# Patient Record
Sex: Female | Born: 1990 | Hispanic: Yes | Marital: Married | Smoking: Never smoker
Health system: Southern US, Community
[De-identification: ages and names within clinical notes are randomized; demographics above are authoritative.]

## PROBLEM LIST (undated history)

## (undated) DIAGNOSIS — Z789 Other specified health status: Secondary | ICD-10-CM

## (undated) HISTORY — PX: OTHER SURGICAL HISTORY: SHX169

## (undated) HISTORY — DX: Other specified health status: Z78.9

---

## 2011-02-13 ENCOUNTER — Emergency Department: Payer: Self-pay | Admitting: Emergency Medicine

## 2012-02-04 ENCOUNTER — Emergency Department: Payer: Self-pay | Admitting: Emergency Medicine

## 2012-02-04 LAB — URINALYSIS, COMPLETE
Bilirubin,UR: NEGATIVE
Glucose,UR: NEGATIVE mg/dL (ref 0–75)
Ketone: NEGATIVE
Leukocyte Esterase: NEGATIVE
Ph: 6 (ref 4.5–8.0)
Protein: NEGATIVE
RBC,UR: 2 /HPF (ref 0–5)
WBC UR: 2 /HPF (ref 0–5)

## 2012-02-04 LAB — CBC
HCT: 43.6 % (ref 35.0–47.0)
HGB: 14.5 g/dL (ref 12.0–16.0)
MCH: 29.8 pg (ref 26.0–34.0)
MCHC: 33.3 g/dL (ref 32.0–36.0)
MCV: 90 fL (ref 80–100)
RDW: 14.3 % (ref 11.5–14.5)
WBC: 14.5 10*3/uL — ABNORMAL HIGH (ref 3.6–11.0)

## 2012-02-04 LAB — COMPREHENSIVE METABOLIC PANEL
Albumin: 4.6 g/dL (ref 3.4–5.0)
Anion Gap: 7 (ref 7–16)
BUN: 14 mg/dL (ref 7–18)
Creatinine: 0.83 mg/dL (ref 0.60–1.30)
EGFR (African American): >60
Osmolality: 284 (ref 275–301)
Potassium: 3.7 mmol/L (ref 3.5–5.1)
SGOT(AST): 21 U/L (ref 15–37)
Sodium: 142 mmol/L (ref 136–145)
Total Protein: 8.9 g/dL — ABNORMAL HIGH (ref 6.4–8.2)

## 2012-02-04 LAB — LIPASE, BLOOD: Lipase: 71 U/L — ABNORMAL LOW (ref 73–393)

## 2015-09-27 ENCOUNTER — Encounter: Payer: Self-pay | Admitting: Emergency Medicine

## 2015-09-27 ENCOUNTER — Emergency Department: Payer: Self-pay

## 2015-09-27 ENCOUNTER — Emergency Department
Admission: EM | Admit: 2015-09-27 | Discharge: 2015-09-27 | Disposition: A | Discharge status: Home / Self Care | Payer: Self-pay | Attending: Emergency Medicine | Admitting: Emergency Medicine

## 2015-09-27 DIAGNOSIS — O209 Hemorrhage in early pregnancy, unspecified: Secondary | ICD-10-CM | POA: Insufficient documentation

## 2015-09-27 DIAGNOSIS — Z3A08 8 weeks gestation of pregnancy: Secondary | ICD-10-CM | POA: Insufficient documentation

## 2015-09-27 DIAGNOSIS — O200 Threatened abortion: Secondary | ICD-10-CM | POA: Insufficient documentation

## 2015-09-27 LAB — URINALYSIS COMPLETE WITH MICROSCOPIC (ARMC ONLY)
BILIRUBIN URINE: NEGATIVE
Bacteria, UA: NONE SEEN
GLUCOSE, UA: NEGATIVE mg/dL
Ketones, ur: NEGATIVE mg/dL
Leukocytes, UA: NEGATIVE
NITRITE: NEGATIVE
Protein, ur: NEGATIVE mg/dL
SPECIFIC GRAVITY, URINE: 1.01 (ref 1.005–1.030)
pH: 6 (ref 5.0–8.0)

## 2015-09-27 LAB — COMPREHENSIVE METABOLIC PANEL
ALT: 13 U/L — ABNORMAL LOW (ref 14–54)
AST: 17 U/L (ref 15–41)
Albumin: 4.4 g/dL (ref 3.5–5.0)
Alkaline Phosphatase: 50 U/L (ref 38–126)
Anion gap: 9 (ref 5–15)
BUN: 10 mg/dL (ref 6–20)
CHLORIDE: 104 mmol/L (ref 101–111)
CO2: 22 mmol/L (ref 22–32)
CREATININE: 0.47 mg/dL (ref 0.44–1.00)
Calcium: 9.3 mg/dL (ref 8.9–10.3)
Glucose, Bld: 80 mg/dL (ref 65–99)
POTASSIUM: 3.6 mmol/L (ref 3.5–5.1)
Sodium: 135 mmol/L (ref 135–145)
Total Bilirubin: 0.7 mg/dL (ref 0.3–1.2)
Total Protein: 7.6 g/dL (ref 6.5–8.1)

## 2015-09-27 LAB — CBC
HCT: 38.7 % (ref 35.0–47.0)
Hemoglobin: 13.3 g/dL (ref 12.0–16.0)
MCH: 31 pg (ref 26.0–34.0)
MCHC: 34.4 g/dL (ref 32.0–36.0)
MCV: 90.3 fL (ref 80.0–100.0)
PLATELETS: 202 10*3/uL (ref 150–440)
RBC: 4.29 MIL/uL (ref 3.80–5.20)
RDW: 13.6 % (ref 11.5–14.5)
WBC: 8.4 10*3/uL (ref 3.6–11.0)

## 2015-09-27 LAB — HCG, QUANTITATIVE, PREGNANCY: HCG, BETA CHAIN, QUANT, S: 22579 m[IU]/mL — AB (ref <5)

## 2015-09-27 LAB — ABO/RH: ABO/RH(D): O POS

## 2015-09-27 NOTE — Discharge Instructions (Signed)
Your ultrasound shows a pregnancy in the uterus.  Your pregnancy hormone (beta HCG) level today is 22,600. Follow-up with obstetrics for further monitoring of your symptoms.  Amenaza de aborto (Threatened Miscarriage) La amenaza de aborto se produce cuando hay hemorragia vaginal durante las primeras 20semanas de [place], pero el embarazo no se interrumpe. Si durante este perodo usted tiene hemorragia vaginal, el mdico le har pruebas para asegurarse de que el embarazo contine. Si las pruebas muestran que usted contina embarazada y que el "beb" en desarrollo (feto) dentro del tero sigue creciendo, se considera que tuvo una [place] de aborto. La amenaza de aborto no implica que el embarazo vaya a Midwife, pero s aumenta el riesgo de perder el embarazo (aborto completo). CAUSAS  Por lo general, no se conoce la causa de la amenaza de aborto. Si el resultado final es el aborto completo, la causa ms frecuente es la cantidad anormal de cromosomas del feto. Los cromosomas son las estructuras internas de las clulas que contienen todo Animator gentico. Environmental manager de las causas de hemorragia vaginal que no ocasionan un aborto incluyen:  Las Clinical research associate.  Las infecciones.  Los cambios hormonales normales durante el [place].  La hemorragia que se produce cuando el vulo se implanta en el tero. FACTORES DE RIESGO Los factores de riesgo de hemorragia al principio del embarazo incluyen:  Obesidad.  Fumar.  El consumo de cantidades excesivas de alcohol o cafena.  El consumo de drogas. SIGNOS Y SNTOMAS  Hemorragia vaginal leve.  Dolor o clicos abdominales leves. DIAGNSTICO  Si tiene hemorragia con o sin dolor abdominal antes de las 20semanas de [place], el mdico le har pruebas para determinar si el embarazo contina. Una prueba importante incluye el uso de ondas sonoras y de una computadora (ecografa) para crear imgenes del interior del tero. Otras pruebas incluyen el  examen interno de la vagina y el tero (examen plvico), y el control de la frecuencia cardaca del feto.  Es posible que le diagnostiquen una amenaza de aborto en los siguientes casos:  La ecografa muestra que el embarazo contina.  La frecuencia cardaca del feto es alta.  El examen plvico muestra que la apertura entre el tero y la vagina (cuello del tero) est cerrada.  Su frecuencia cardaca y su presin arterial estn estables.  Los [employer] Corporation de sangre confirman que el embarazo contina. TRATAMIENTO  No se ha demostrado que ningn tratamiento evite que una amenaza de aborto se [place] en un aborto completo. Sin embargo, los cuidados [employer] hogar son importantes.  INSTRUCCIONES PARA EL CUIDADO EN EL HOGAR   Asegrese de asistir a todas las citas de cuidados prenatales. Esto es [employer].  Descanse lo suficiente.  No tenga relaciones sexuales ni use tampones si tiene hemorragia vaginal.  No se haga duchas vaginales.  No fume ni consuma drogas.  No beba alcohol.  Evite la cafena. SOLICITE ATENCIN MDICA SI:  Tiene una ligera hemorragia o manchado vaginal durante el embarazo.  Tiene dolor o clicos en el abdomen.  Tiene fiebre. SOLICITE ATENCIN MDICA DE INMEDIATO SI:  Tiene una hemorragia vaginal abundante.  Elimina cogulos de sangre por la vagina.  Siente dolor en la parte baja de la espalda o clicos abdominales intensos.  Tiene fiebre, escalofros y dolor abdominal intenso. ASEGRESE DE QUE:  Comprende estas instrucciones.  Controlar su afeccin.  Recibir ayuda de inmediato si no mejora o si empeora.   Esta informacin no tiene Theme park manager el consejo del mdico. Manufacturing engineer  de hacerle al mdico cualquier pregunta que tenga.   Document Released: 01/06/2005 Document Revised: 04/03/2013 Elsevier Interactive Patient Education 2016 [employer]  Hemorragia vaginal durante Firefighterel embarazo (primer trimestre) (Vaginal Bleeding  During Pregnancy, First Trimester) Durante los primeros meses del embarazo es relativamente frecuente que se presente una pequea hemorragia (manchas). Esta situacin generalmente mejora por s misma. Estas hemorragias o manchas tienen diversas causas al inicio del embarazo. Algunas manchas pueden estar relacionadas al Big Lotsembarazo y otras no. En la International Business Machinesmayora de los casos, la hemorragia es normal y no es un problema. Sin embargo, la hemorragia tambin puede ser un signo de algo grave. Debe informar a su mdico de inmediato si tiene alguna hemorragia vaginal. Algunas causas posibles de hemorragia vaginal durante el primer trimestre incluyen:  Infeccin o inflamacin del cuello del tero.  Crecimientos anormales (plipos) en el cuello del tero.  Aborto espontneo o amenaza de aborto espontneo.  Tejido del Psychiatristembarazo se ha desarrollado fuera del tero y en una trompa de falopio (embarazo ectpico).  Se han desarrollado pequeos quistes en el tero en lugar de tejido de embarazo (embarazo molar). INSTRUCCIONES PARA EL CUIDADO EN EL HOGAR  Controle su afeccin para ver si hay cambios. Las siguientes indicaciones ayudarn a Psychologist, educationalaliviar cualquier Longs Drug Storesmolestia que pueda sentir:  Siga las indicaciones del mdico para restringir su actividad. Si el mdico le indica descanso en la cama, debe quedarse en la cama y levantarse solo para ir al bao. No obstante, el mdico puede permitirle que continu con tareas livianas.  Si es necesario, organcese para que alguien le ayude con las actividades y responsabilidades cotidianas mientras est en cama.  Lleve un registro de la cantidad y la saturacin de las toallas higinicas que Landscape architectutiliza cada da. Anote este dato.  No use tampones.No se haga duchas vaginales.  No tenga relaciones sexuales u orgasmos hasta que el mdico la autorice.  Si elimina tejido por la vagina, gurdelo para mostrrselo al American Expressmdico.  North[place]ome solo medicamentos de venta libre o recetados, segn las  indicaciones del mdico.  No tome aspirina, ya que puede causar hemorragias.  Cumpla con todas las visitas de control, segn le indique su mdico. SOLICITE ATENCIN MDICA SI:  Tiene un sangrado vaginal en cualquier momento del embarazo.  Tiene calambres o dolores de Hawthorneparto.  Tiene fiebre que los medicamentos no Sports coachpueden controlar. SOLICITE ATENCIN MDICA DE INMEDIATO SI:   Siente calambres intensos en la espalda o en el vientre (abdomen).  Elimina cogulos grandes o tejido por la vagina.  La hemorragia aumenta.  Si se siente mareada, dbil o se desmaya.  Tiene escalofros.  Tiene una prdida importante o sale lquido a borbotones por la vagina.  Se desmaya mientras defeca. ASEGRESE DE QUE:  Comprende estas instrucciones.  Controlar su afeccin.  Recibir ayuda de inmediato si no mejora o si empeora.   Esta informacin no tiene Theme park managercomo fin reemplazar el consejo del mdico. Asegrese de hacerle al mdico cualquier pregunta que tenga.   Document Released: 01/06/2005 Document Revised: 04/03/2013 Elsevier Interactive Patient Education [employer]2016 Elsevier Inc.

## 2015-09-27 NOTE — ED Provider Notes (Signed)
[facility] Emergency Department Provider Note  ____________________________________________  Time seen: 3:50 PM  I have reviewed the triage vital signs and the nursing notes.   HISTORY  Chief Complaint Vaginal Bleeding Patient interviewed and examined with Spanish interpreter at bedside   HPI Dominique Durham is a 25 y.o. female who reports vaginal bleeding today described as spotting. LMP is 07/31/2015, she is about [redacted] weeks pregnant which she just discovered within the last week. She has not had prenatal care yet but is taking prenatal vitamins. She is pregnant once before and has a 32-year-old daughter. She is certain that her blood type is O+.     No past medical history on file. None  There are no active problems to display for this patient.    No past surgical history on file. None  Current Outpatient Rx  Name  Route  Sig  Dispense  Refill  . Prenatal Vit-Fe Fumarate-FA (PRENATAL MULTIVITAMIN) TABS tablet   Oral   Take 1 tablet by mouth every morning.            Allergies Review of patient's allergies indicates no known allergies.   No family history on file.  Social History Social History  Substance Use Topics  . Smoking status: Not on file  . Smokeless tobacco: Not on file  . Alcohol Use: Not on file  No tobacco alcohol or drug use  Review of Systems  Constitutional:   No fever or chills.  Eyes:   No vision changes.  ENT:   No sore throat. No rhinorrhea. Cardiovascular:   No chest pain. Respiratory:   No dyspnea or cough. Gastrointestinal:   Negative for abdominal pain, vomiting and diarrhea.  Genitourinary:   Negative for dysuria or difficulty urinating. Musculoskeletal:   Negative for focal pain or swelling Neurological:   Negative for headaches 10-point ROS otherwise negative.  ____________________________________________   PHYSICAL EXAM:  VITAL SIGNS: ED Triage Vitals  Enc Vitals Group     BP 09/27/15  1234 126/71 mmHg     Pulse Rate 09/27/15 1234 74     Resp 09/27/15 1234 20     Temp 09/27/15 1234 98.7 F (37.1 C)     Temp Source 09/27/15 1234 Oral     SpO2 09/27/15 1234 99 %     Weight 09/27/15 1234 112 lb (50.803 kg)     Height 09/27/15 1234  (1.473 m)     Head Cir --      Peak Flow --      Pain Score 09/27/15 1240 5     Pain Loc --      Pain Edu? --      Excl. in GC? --     Vital signs reviewed, nursing assessments reviewed.   Constitutional:   Alert and oriented. Well appearing and in no distress. Eyes:   No scleral icterus. No conjunctival pallor. PERRL. EOMI.  No nystagmus. ENT   Head:   Normocephalic and atraumatic.   Nose:   No congestion/rhinnorhea. No septal hematoma   Mouth/Throat:   MMM, no pharyngeal erythema. No peritonsillar mass.    Neck:   No stridor. No SubQ emphysema. No meningismus. Hematological/Lymphatic/Immunilogical:   No cervical lymphadenopathy. Cardiovascular:   RRR. Symmetric bilateral radial and DP pulses.  No murmurs.  Respiratory:   Normal respiratory effort without tachypnea nor retractions. Breath sounds are clear and equal bilaterally. No wheezes/rales/rhonchi. Gastrointestinal:   Soft and nontender. Non distended. There is no CVA tenderness.  No  rebound, rigidity, or guarding. Genitourinary:   deferred Musculoskeletal:   Nontender with normal range of motion in all extremities. No joint effusions.  No lower extremity tenderness.  No edema. Neurologic:   Normal speech and language.  CN 2-10 normal. Motor grossly intact. No gross focal neurologic deficits are appreciated.  Skin:    Skin is warm, dry and intact. No rash noted.  No petechiae, purpura, or bullae.  ____________________________________________    LABS (pertinent positives/negatives) (all labs ordered are listed, but only abnormal results are displayed) Labs Reviewed  HCG, QUANTITATIVE, PREGNANCY - Abnormal; Notable for the following:    hCG, Beta Chain,  Quant, S 22579 (*)    All other components within normal limits  URINALYSIS COMPLETEWITH MICROSCOPIC ([facility] ONLY) - Abnormal; Notable for the following:    Color, Urine STRAW (*)    APPearance CLEAR (*)    Hgb urine dipstick 2+ (*)    Squamous Epithelial / LPF 0-5 (*)    All other components within normal limits  COMPREHENSIVE METABOLIC PANEL - Abnormal; Notable for the following:    ALT 13 (*)    All other components within normal limits  CBC  ABO/RH   ____________________________________________   EKG    ____________________________________________    RADIOLOGY  Ultrasound pelvis reveals live IUP, 6 weeks 1 day EGA.  ____________________________________________   PROCEDURES   ____________________________________________   INITIAL IMPRESSION / ASSESSMENT AND PLAN / ED COURSE  Pertinent labs & imaging results that were available during my care of the patient were reviewed by me and considered in my medical decision making (see chart for details).  Well-appearing no acute distress. First trimester vaginal bleeding. Low suspicion for torsion or ectopic, no abnormal discharge or pain to suggest TOA or PID or STI, but we'll get an ultrasound to evaluate pregnancy and be sure that it is an IUP. Initial lab draw did not include a blood type, and the patient declines being stuck again to check her blood type because she is sure that her blood type is O+. This is reasonable.    ----------------------------------------- 6:15 PM on 09/27/2015 -----------------------------------------  Workup unconcerning, Rh+, follow-up with OB.   ____________________________________________   FINAL CLINICAL IMPRESSION(S) / ED DIAGNOSES  Final diagnoses:  Vaginal bleeding before [redacted] weeks gestation  Threatened miscarriage       Portions of this note were generated with dragon dictation software. Dictation errors may occur despite best attempts at proofreading.   Sharman CheekPhillip  Barnes Florek, MD 09/27/15 [phone]

## 2015-09-27 NOTE — ED Notes (Signed)
Interpreter paged for assessment

## 2015-09-27 NOTE — ED Notes (Signed)
Pt here with vaginal bleeding, determined pregnancy a week ago, pt's lmp April 20, seen at the clinic last week

## 2017-04-20 LAB — HM PAP SMEAR: HM Pap smear: NEGATIVE

## 2017-12-17 LAB — HM HIV SCREENING LAB: HM HIV Screening: NEGATIVE

## 2017-12-20 DIAGNOSIS — A159 Respiratory tuberculosis unspecified: Secondary | ICD-10-CM | POA: Insufficient documentation

## 2017-12-24 DIAGNOSIS — A159 Respiratory tuberculosis unspecified: Secondary | ICD-10-CM | POA: Insufficient documentation

## 2017-12-26 ENCOUNTER — Ambulatory Visit
Admission: RE | Admit: 2017-12-26 | Discharge: 2017-12-26 | Disposition: A | Discharge status: Home / Self Care | Payer: Self-pay | Source: Ambulatory Visit | Attending: Family Medicine | Admitting: Family Medicine

## 2017-12-26 ENCOUNTER — Other Ambulatory Visit (HOSPITAL_COMMUNITY): Payer: Self-pay | Admitting: Family Medicine

## 2017-12-26 DIAGNOSIS — A159 Respiratory tuberculosis unspecified: Secondary | ICD-10-CM

## 2018-02-11 IMAGING — US US OB COMP LESS 14 WK
1 series · 13 of 28 positions shown · non-contrast
Comparison: None.

CLINICAL DATA: Patient is 7 weeks and 4 days pregnant based on her
last menstrual. Patient presents with vaginal bleeding for 9 hours.
Quantitative beta HCG level is [DATE].

EXAM:
OBSTETRIC <14 WK US AND TRANSVAGINAL OB US
TECHNIQUE: Both transabdominal and transvaginal ultrasound examinations were
performed for complete evaluation of the gestation as well as the
maternal uterus, adnexal regions, and pelvic cul-de-sac.
Transvaginal technique was performed to assess early pregnancy.

[Series 1: us ob comp less 14 wk · 0.22mm/px · 13 of 91 slices shown]
[im 4/91]
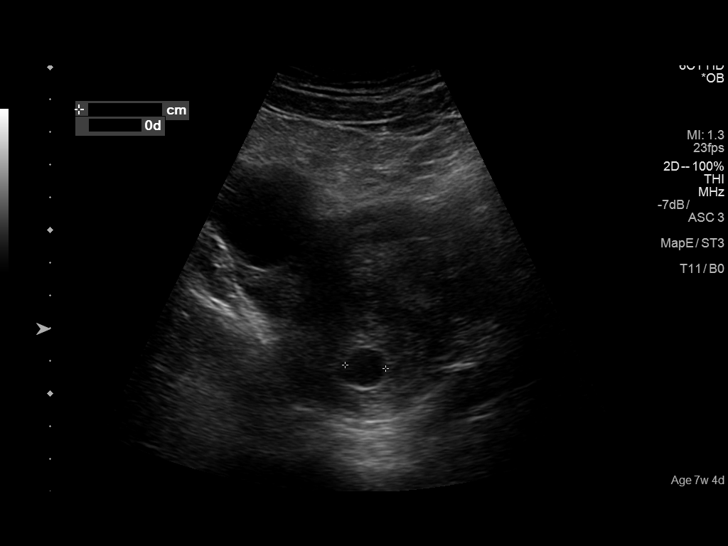
[im 11/91]
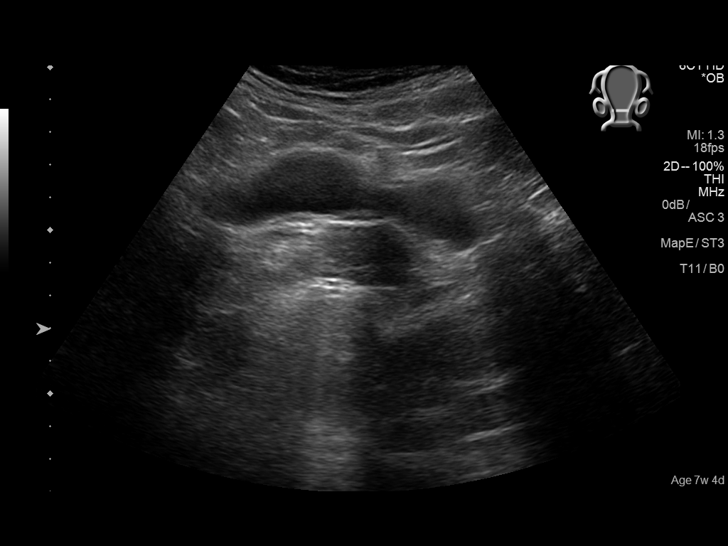
[im 17/91]
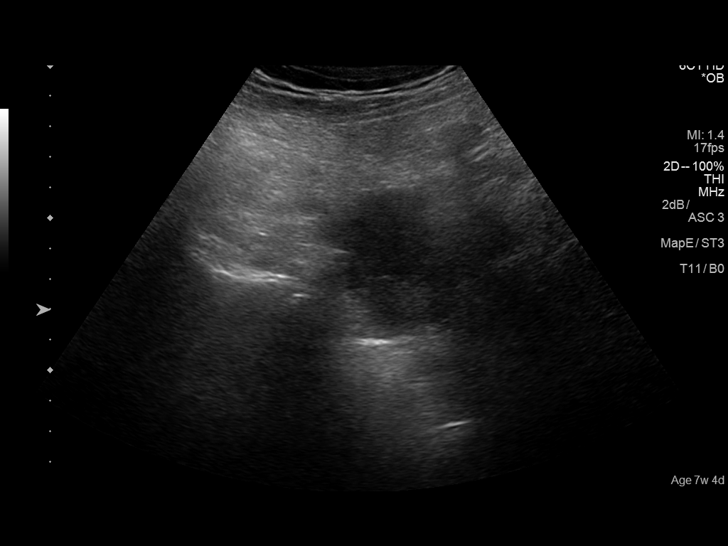
[im 24/91]
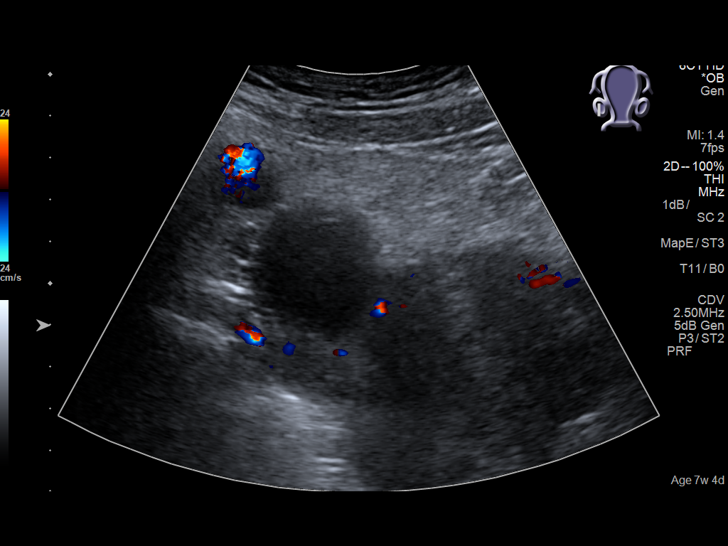
[im 31/91]
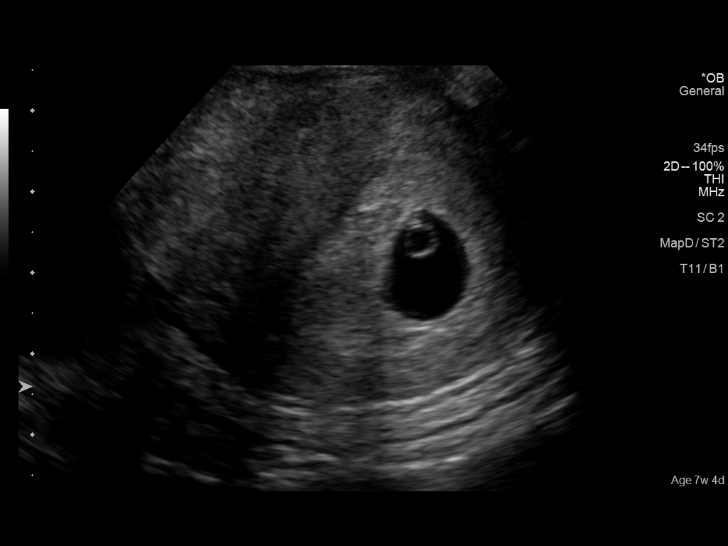
[im 37/91]
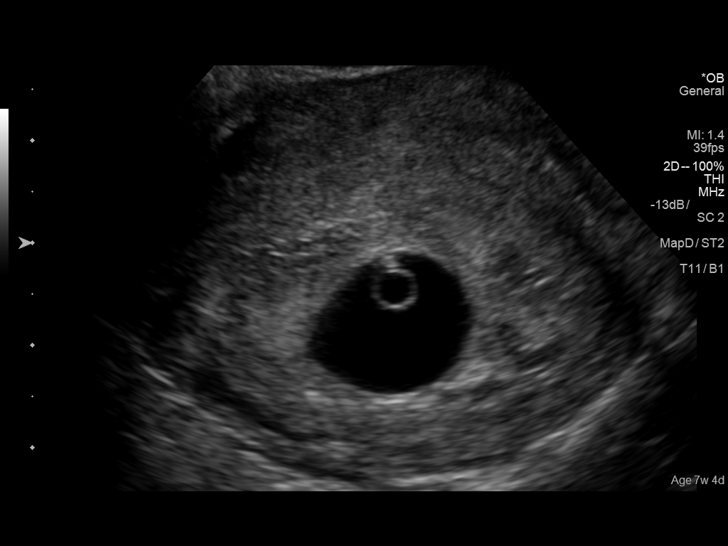
[im 47/91]
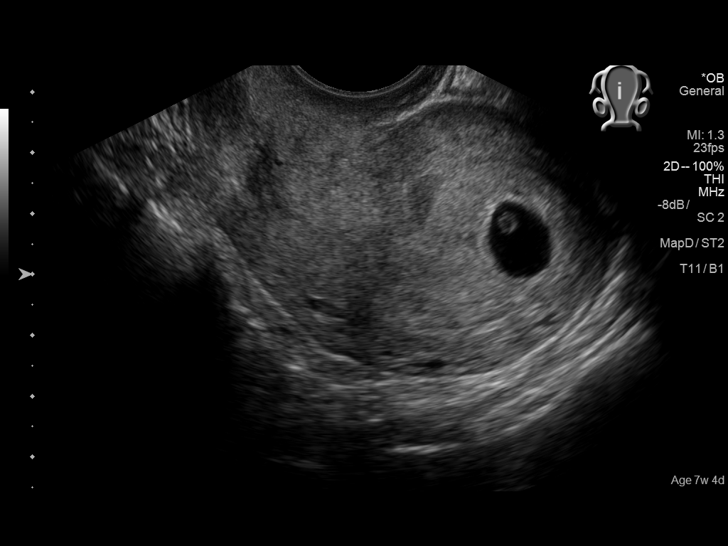
[im 54/91]
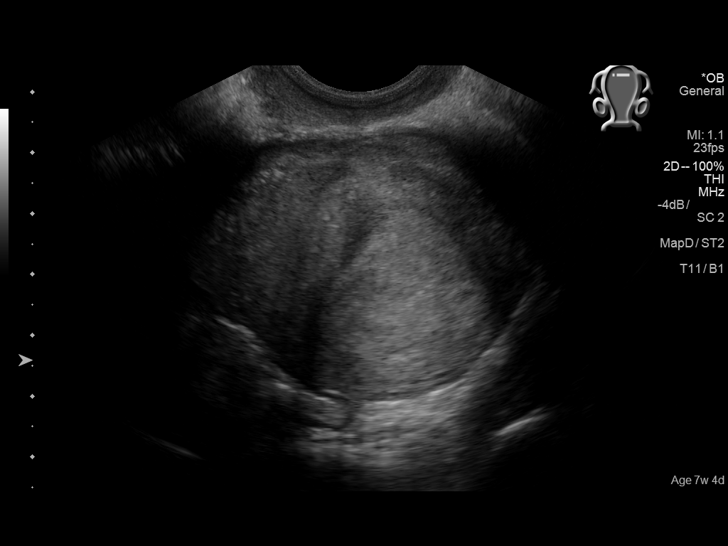
[im 61/91]
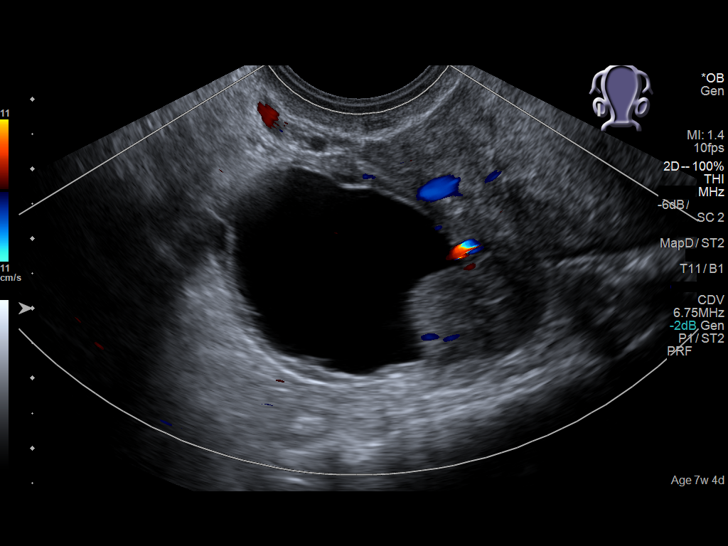
[im 67/91]
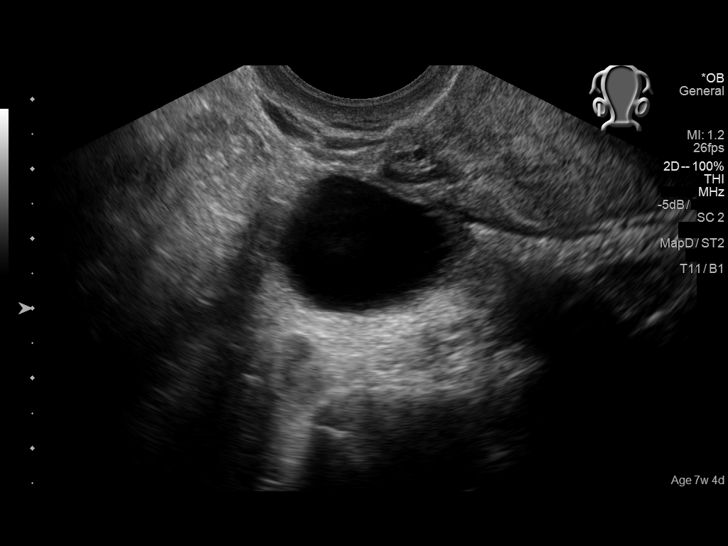
[im 74/91]
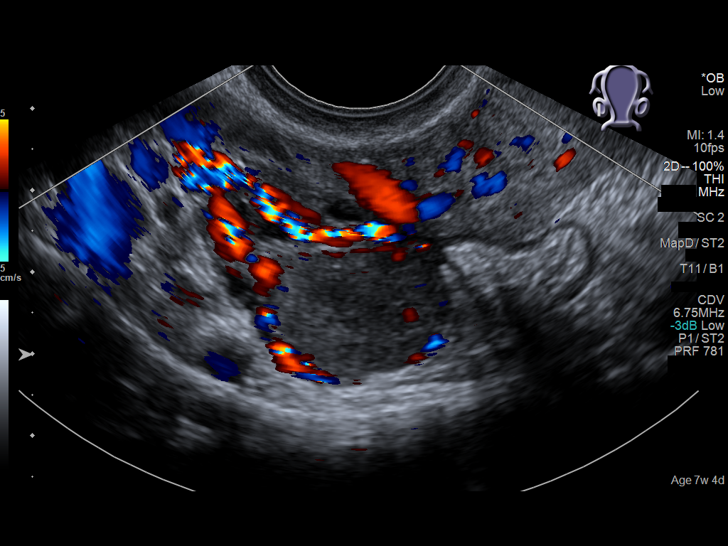
[im 81/91]
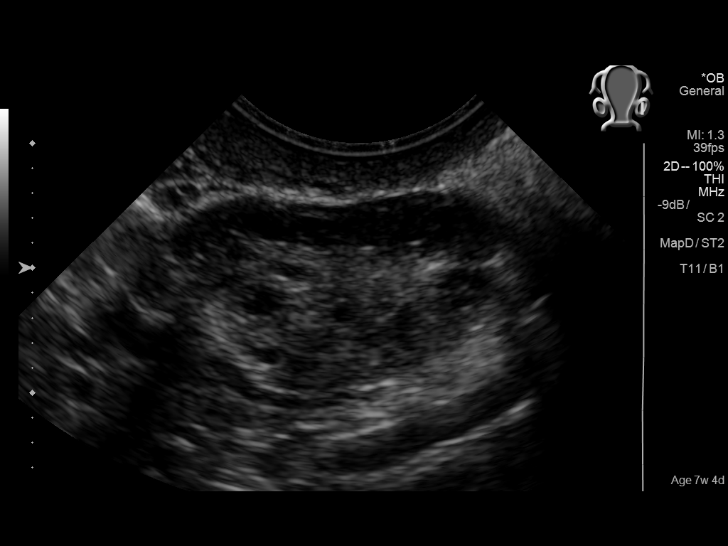
[im 87/91]
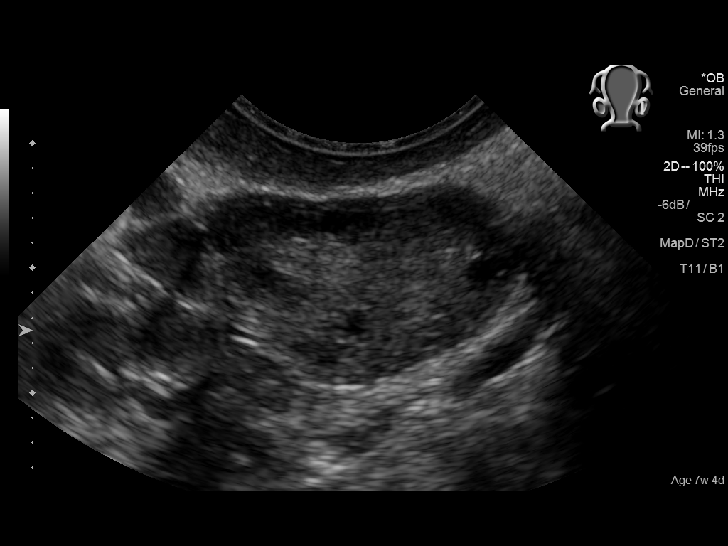

[13 of 28 positions shown; findings below may reference images not displayed]

FINDINGS: Intrauterine gestational sac: Yes

Yolk sac:  Yes

Embryo:  Yes

Cardiac Activity: Yes

Heart Rate: 113  bpm

CRL:  4  mm   6 w   1 d                  US EDC: 05/21/2016

Subchorionic hemorrhage:  None visualized.

Maternal uterus/adnexae: Normal appearance of the uterus. Right
ovary mildly enlarged by an irregular but otherwise simple appearing
cyst measuring 3.5 x 2.7 x 3.7 cm. There is also a hypoechoic
circumscribed area in the right ovary is likely corpus luteum
measuring 2.3 cm. Normal left ovary. No adnexal masses. No pelvic
free fluid.
IMPRESSION: 1. Single live intrauterine pregnancy with a measured gestational
age of 6 weeks and 1 day.
2. Dominant simple appearing cyst arises from the right ovary
measuring 3.7 cm. No adnexal masses.

## 2018-05-18 DIAGNOSIS — A159 Respiratory tuberculosis unspecified: Secondary | ICD-10-CM

## 2018-06-05 ENCOUNTER — Ambulatory Visit
Admission: RE | Admit: 2018-06-05 | Discharge: 2018-06-05 | Disposition: A | Discharge status: Home / Self Care | Payer: Self-pay | Source: Ambulatory Visit | Attending: Family Medicine | Admitting: Family Medicine

## 2018-06-05 ENCOUNTER — Other Ambulatory Visit: Payer: Self-pay | Admitting: Family Medicine

## 2018-06-05 DIAGNOSIS — A159 Respiratory tuberculosis unspecified: Secondary | ICD-10-CM

## 2019-01-26 ENCOUNTER — Other Ambulatory Visit: Payer: Self-pay

## 2019-01-26 ENCOUNTER — Ambulatory Visit (LOCAL_COMMUNITY_HEALTH_CENTER): Payer: Self-pay | Admitting: Advanced Practice Midwife

## 2019-01-26 VITALS — BP 118/73 | Ht <= 58 in | Wt 128.0 lb

## 2019-01-26 DIAGNOSIS — Z3009 Encounter for other general counseling and advice on contraception: Secondary | ICD-10-CM

## 2019-01-26 DIAGNOSIS — Z3046 Encounter for surveillance of implantable subdermal contraceptive: Secondary | ICD-10-CM

## 2019-01-26 NOTE — Progress Notes (Signed)
   [place] problem visit  [place] [facility] Department  Subjective:  Dominique Durham is a 28 y.o. being seen today for her 74 yo son jumped on her left arm on 01/21/19 and now her Nexplanon site hurts  Chief Complaint  Patient presents with  . Contraception    Nexplanon problem    HPI C/o pain at [place] site after 2 yo son jumped on her arm on 01/21/19.  Nexplanon inserted 11/01/17.  Does the patient have a current or past history of drug use? No   No components found for: HCV]   Health Maintenance Due  Topic Date Due  . HIV Screening  09/27/2006  . TETANUS/TDAP  09/27/2010  . PAP-Cervical Cytology Screening  09/26/2012  . PAP SMEAR-Modifier  09/26/2012  . INFLUENZA VACCINE  11/11/2018    ROS  The following portions of the patient's history were reviewed and updated as appropriate: allergies, current medications, past family history, past medical history, past social history, past surgical history and problem list. Problem list updated.   See flowsheet for other program required questions.  Objective:   Vitals:   01/26/19 1318  BP: 118/73  Weight: 128 lb (58.1 kg)  Height: 4\' 10"  (1.473 m)    Physical Exam  Left arm without bruising.  Sl tenderness on palpation of device.  Inflammation so unable to feel center of device.  Antoine Primas, PA in to palpate and states device is intact with inflammation present  Assessment and Plan:  Dominique Durham is a 28 y.o. female presenting to the [facility] Department for a Women's Health problem visit  1. Family planning   2. Encounter for surveillance of implantable subdermal contraceptive To take Ibuprofen q8 hrsx 3-5 days to decrease inflammation May apply ice packs to site prn for comfort     Return if symptoms worsen or fail to improve.  No future appointments.  Herbie Saxon, CNM

## 2019-01-26 NOTE — Progress Notes (Signed)
Here today with complaint of pain in area of Nexplanon placement. States this past Sunday her son "jumped very hard" on her arm where the Nexplanon is located and she has been experiencing pain since that time. States burning and sharp type pain in same area where Nexplanon is located. Declines STD screening and bloodwork today. Hal Morales, RN

## 2019-08-02 ENCOUNTER — Telehealth: Payer: Self-pay | Admitting: Family Medicine

## 2019-08-02 NOTE — Telephone Encounter (Signed)
Patient would like to speak to nurse about her nexplanon and bleeding issues she is having, and probably wants to get it removed.

## 2019-08-03 NOTE — Telephone Encounter (Signed)
This RN returned pt's call at phone # provided with Dominique Durham to interpret. Pt reports that she has the Nexplanon that was placed 11/01/2017 and has been having a period for 2 months with cramping. Pt also reports that she is having mild pain in the arm that has the Nexplanon in it after her son hit her arm. Pt denies any swelling, redness, or drainage from the site, but reports that sometimes when it hurts it feels a little warm to touch. Pt reports that she started off with spotting with her menstrual period but for the past few days it has been heavy and changes her pad 8 to 10 times a day, pt reports wearing regular pads during the day and night pads at night and that she does not soak through the pads. Pt denies any fever or dizziness, but reports that she is a little tired. Counseled pt that I would speak with provider about her concerns to see what they recommend and give her a call back. This RN consulted with Sadie Haber, PA who recommends that pt tries Ibuprofen 800mg  every 8 hours with food for no more than 5 days and to schedule pt to come in to be seen next week, and that if symptoms worsen to go to Urgent Care or ER. This RN returned call to pt and counseled pt per , PA recommendations and pt states understanding. Pt denies any allergies. Pt scheduled to come in for [facility] visit for 08/08/2019 at 1:40pm per pt request as that is the earliest she would be able to come in. Pt aware of appt date and time and to arrive early for check-in. Pt with no other questions or concerns at this time.08/10/2019, RN

## 2019-08-03 NOTE — Telephone Encounter (Signed)
Consulted by RN re:  patient concerns.  Reviewed RN note and agree that  plan as documented is what we discussed.

## 2019-08-08 ENCOUNTER — Ambulatory Visit: Payer: Self-pay

## 2019-08-14 ENCOUNTER — Other Ambulatory Visit: Payer: Self-pay

## 2019-08-14 ENCOUNTER — Encounter: Payer: Self-pay | Admitting: Family Medicine

## 2019-08-14 ENCOUNTER — Ambulatory Visit (LOCAL_COMMUNITY_HEALTH_CENTER): Payer: Self-pay | Admitting: Family Medicine

## 2019-08-14 VITALS — BP 119/80 | Ht 59.0 in | Wt 129.0 lb

## 2019-08-14 DIAGNOSIS — Z30013 Encounter for initial prescription of injectable contraceptive: Secondary | ICD-10-CM

## 2019-08-14 DIAGNOSIS — Z3009 Encounter for other general counseling and advice on contraception: Secondary | ICD-10-CM

## 2019-08-14 MED ORDER — MEDROXYPROGESTERONE ACETATE 150 MG/ML IM SUSP
150.0000 mg | INTRAMUSCULAR | Status: AC
  Administered 2019-08-14 – 2020-04-21 (×4): 150 mg via INTRAMUSCULAR

## 2019-08-14 NOTE — Progress Notes (Signed)
In with c/o pain in Implant area x ~2-3 wks & tender to touch; reports had bleeding x ~1 mth but was relieved with Ibuprofen Sharlette Dense, RN

## 2019-08-14 NOTE — Progress Notes (Signed)
Family Planning Visit  Subjective:  Dominique Durham is a 29 y.o. being seen today for  Chief Complaint  Patient presents with  . 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  HPI  Patient reports she would like her Nexplanon removed. It was placed 11/01/2017. Her son hit her arm last July, after this her arm has been painful to bend and tender to touch around Nexplanon site. She would like Nexplanon out and to re-start depo.  Also, for past 3 months has been bleeding excessively, last bleeding stopped last week w/use of ibuprofen 800mg  TID. No abd pain, fever, dizziness.   Pt denies all of the following, which are contraindications to Depo use: Known breast cancer Pregnancy Also denies: Hypertension (CDC cat 2 if mild, cat 3 if severe) Severe cirrhosis, hepatocellular adenoma Diabetes with nephrosis or vascular complications Ischemic heart disease or multiple risk factors for atherosclerotic disease, and some forms of lupus Pregnancy planned within the next year Long-term use of corticosteroid therapy in women with a history of, or risk factors for, nontraumatic (frailty) fractures.  Current use of aminoglutethimide (usually for the treatment of Cushing's syndrome) because aminoglutethimide may increase metabolism of progestins    Patient's last menstrual period was 06/09/2019 (approximate). BCM: nexplanon Pt desires EC? n/a  Last pap: 04/20/2017: negative  Last breast exam: 2019  Patient reports 1 partner(s) in last year. Do they desire STI screening (if no, why not)? No, declines  Does the patient desire a pregnancy in the next year? no   29 y.o., Body mass index is 26.05 kg/m. - Is patient eligible for HA1C diabetes screening based on BMI and age >21?  no  Has patient been screened once for HCV in the past?  no  No results found for: HCVAB  Does the patient have current of drug use, have a partner with drug use, and/or has  been incarcerated since last result? no If yes-- Screen for HCV through [facility]   Does the patient meet criteria for HBV testing? no  Criteria:  -Household, sexual or needle sharing contact with HBV -History of drug use -HIV positive -Those with known Hep C  See flowsheet for other program required questions.   There are no preventive care reminders to display for this patient.  ROS  See HPI.  The following portions of the patient's history were reviewed and updated as appropriate: allergies, current medications, past family history, past medical history, past social history, past surgical history and problem list. Problem list updated.  Objective:   Vitals:   08/14/19 1126  BP: 119/80  Weight: 129 lb (58.5 kg)  Height: 4\' 11"  (1.499 m)     Physical Exam  Gen: well appearing, NAD HEENT: no scleral icterus Lung: Normal WOB Ext: well perfused, no edema    Assessment and Plan:  Dominique Durham is a 29 y.o. female presenting to the [facility] Department for a well woman exam/family planning visit  Contraception counseling: Reviewed all forms of birth control options in the tiered based approach. available including abstinence; over the counter/barrier methods; hormonal contraceptive medication including pill, patch, ring, injection,contraceptive implant, ECP; hormonal and nonhormonal IUDs; permanent sterilization options including vasectomy and the various tubal sterilization modalities. Risks, benefits, and typical effectiveness rates were reviewed.  Questions were answered.  Written information was also given to the patient to review.  Patient desires depo, this was prescribed for patient. She will follow up in  3 months  for surveillance.  She was told to call with any further questions, or with any concerns about this method of contraception.  Emphasized use of condoms 100% of the time for STI prevention.  Emergency Contraception: n/a - nexplanon in  place   1. Family planning services -Will begin depo today in hopes that this helps recent irregular bleeding. Pt to be scheduled w/in next 3 months for Nexplanon removal w/qualified provider.  - medroxyPROGESTERone (DEPO-PROVERA) injection 150 mg. (Rx x1 yr.)     Return in about 1 week (around 08/21/2019) for nexplanon removal.  Future Appointments  Date Time Provider Department Center  08/23/2019 10:40 AM [facility] PROVIDER [facility] None    Ann Held, PA-C

## 2019-08-14 NOTE — Progress Notes (Signed)
Depo given and tolerated well. Nexplanon removal appt scheduled for 08/23/19 @ 10:40. Instructed to arrive at 10:20 for check in. Tawny Hopping, RN

## 2019-08-23 ENCOUNTER — Ambulatory Visit (LOCAL_COMMUNITY_HEALTH_CENTER): Payer: Self-pay | Admitting: Physician Assistant

## 2019-08-23 ENCOUNTER — Other Ambulatory Visit: Payer: Self-pay

## 2019-08-23 ENCOUNTER — Encounter: Payer: Self-pay | Admitting: Physician Assistant

## 2019-08-23 VITALS — BP 126/79 | Ht 59.0 in | Wt 127.4 lb

## 2019-08-23 DIAGNOSIS — Z3009 Encounter for other general counseling and advice on contraception: Secondary | ICD-10-CM

## 2019-08-23 DIAGNOSIS — Z3046 Encounter for surveillance of implantable subdermal contraceptive: Secondary | ICD-10-CM

## 2019-08-23 DIAGNOSIS — Z3049 Encounter for surveillance of other contraceptives: Secondary | ICD-10-CM

## 2019-08-23 NOTE — Progress Notes (Signed)
Here today for Nexplanon removal for Nexplanon placed here 11/01/2017. Last PE and Pap Smear was 04/20/2017. Patient received Depo Injection here 08/14/2019 and wants to continue with that for birth control. Advised patient she would need to schedule an RP with next Depo appt. Tawny Hopping, RN

## 2019-08-23 NOTE — Progress Notes (Signed)
Family Planning Visit- Repeat Yearly Visit  Subjective:  Dominique Durham is a 29 y.o. G2P1002  being seen today for Nexplanon removal.    She is currently using Depo-Provera injections for pregnancy prevention. Patient reports she does not want a pregnancy in the next year. 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  Chief Complaint  Patient presents with  . Procedure    Nexplanon removal    Patient reports pain at site of implant since her toddler son jumped on her arm several months ago. Started DMPA a week ago in anticipation of Nexplanon removal today.  Patient denies any other concerns.   See flowsheet for other program required questions.   Body mass index is 25.73 kg/m. - Patient is eligible for diabetes screening based on BMI and age >36?  no HA1C ordered? not applicable  Patient reports 1 of partners in last year. Desires STI screening?  No - not indicated   Has patient been screened once for HCV in the past?  No   No results found for: HCVAB  Does the patient have current of drug use, have a partner with drug use, and/or has been incarcerated since last result? No  If yes-- Screen for HCV through [facility]   Does the patient meet criteria for HBV testing? No  Criteria:  -Household, sexual or needle sharing contact with HBV -History of drug use -HIV positive -Those with known Hep C   There are no preventive care reminders to display for this patient.  ROS  The following portions of the patient's history were reviewed and updated as appropriate: allergies, current medications, past family history, past medical history, past social history, past surgical history and problem list. Problem list updated.  Objective:   Vitals:   08/23/19 1023  BP: 126/79  Weight: 127 lb 6.4 oz (57.8 kg)  Height: 4\' 11"  (1.499 m)    Physical Exam  4cm firm straight rod palpable under skin upper medial L arm. Sl tender, no erythema, warmth,  swelling.   Nexplanon Removal Patient identified, informed consent performed, consent signed.   Appropriate time out taken. Nexplanon site identified.  Area prepped in usual sterile fashon. 3 ml of 1% lidocaine with Epinephrine was used to anesthetize the area at the distal end of the implant and along implant site. A small stab incision was made right beside the implant on the distal portion. There was difficulty removing the rod, so the medial end of the implant was prepped/anethetized and a 2nd stab incision made. The Nexplanon rod was grasped using hemostats/manual and removed without difficulty.  There was minimal blood loss. There were no complications, with the exception that the patient complained of pain throughout the procedure. She elected to continue with the removal.  Steri-strips were applied over the small incision.  A pressure bandage was applied to reduce any bruising.  The patient tolerated the procedure fairly well and was given post procedure instructions.    Assessment and Plan:  Dominique Durham is a 29 y.o. female G2P1002 presenting to the [facility] Department for Nexplanon removal/family planning visit. Already on DMPA instead. Questions were answered.  Written information was also given to the patient to review.  Patient desires DMPA, this was already prescribed for patient.  She was told to call with any further questions, or with any concerns about this method of contraception.  Emphasized use of condoms 100% of the time for STI prevention.  Patient was not offered ECP.  1. Family planning services Continue DMPA as already prescribed.  2. Nexplanon removal Procedure completed at patient request.  Return in about 11 weeks (around 11/08/2019).  No future appointments.  Lora Havens, PA-C

## 2019-11-14 ENCOUNTER — Ambulatory Visit (LOCAL_COMMUNITY_HEALTH_CENTER): Payer: Self-pay

## 2019-11-14 ENCOUNTER — Other Ambulatory Visit: Payer: Self-pay

## 2019-11-14 VITALS — BP 119/81 | HR 66 | Ht 59.0 in | Wt 129.5 lb

## 2019-11-14 DIAGNOSIS — Z30013 Encounter for initial prescription of injectable contraceptive: Secondary | ICD-10-CM

## 2019-11-14 DIAGNOSIS — Z3009 Encounter for other general counseling and advice on contraception: Secondary | ICD-10-CM

## 2019-11-14 NOTE — Progress Notes (Signed)
Depo administered withiout difficulty per 08/14/2019 written order of Samara Snide PA-C and client tolerated without complaint. Jossie Ng, RN

## 2020-01-31 ENCOUNTER — Other Ambulatory Visit: Payer: Self-pay

## 2020-01-31 ENCOUNTER — Ambulatory Visit (LOCAL_COMMUNITY_HEALTH_CENTER): Payer: Self-pay

## 2020-01-31 VITALS — BP 109/57 | Ht 59.0 in | Wt 127.0 lb

## 2020-01-31 DIAGNOSIS — Z30013 Encounter for initial prescription of injectable contraceptive: Secondary | ICD-10-CM

## 2020-01-31 DIAGNOSIS — Z3009 Encounter for other general counseling and advice on contraception: Secondary | ICD-10-CM

## 2020-01-31 NOTE — Progress Notes (Signed)
11 weeks 1 day post depo. Voices no prob. Today. Depo given per order Maximiano Coss, PA-C dated 08/14/2019. Tolerated well. Next depo due 04/18/2019, reminder given. Lang. Line used today. Jerel Shepherd, RN

## 2020-04-17 ENCOUNTER — Ambulatory Visit: Payer: Self-pay

## 2020-04-21 ENCOUNTER — Ambulatory Visit (LOCAL_COMMUNITY_HEALTH_CENTER): Payer: Self-pay

## 2020-04-21 ENCOUNTER — Other Ambulatory Visit: Payer: Self-pay

## 2020-04-21 VITALS — BP 127/90 | Ht 59.0 in | Wt 129.0 lb

## 2020-04-21 DIAGNOSIS — Z30013 Encounter for initial prescription of injectable contraceptive: Secondary | ICD-10-CM

## 2020-04-21 DIAGNOSIS — Z3009 Encounter for other general counseling and advice on contraception: Secondary | ICD-10-CM

## 2020-04-21 NOTE — Progress Notes (Signed)
Depo given in left delt per Maximiano Coss PA 08/14/19; tolerated well Richmond Campbell, RN

## 2020-07-07 ENCOUNTER — Other Ambulatory Visit: Payer: Self-pay

## 2020-07-07 ENCOUNTER — Ambulatory Visit (LOCAL_COMMUNITY_HEALTH_CENTER): Payer: Self-pay

## 2020-07-07 VITALS — BP 122/82 | Ht 59.0 in | Wt 133.5 lb

## 2020-07-07 DIAGNOSIS — Z30013 Encounter for initial prescription of injectable contraceptive: Secondary | ICD-10-CM

## 2020-07-07 DIAGNOSIS — Z3009 Encounter for other general counseling and advice on contraception: Secondary | ICD-10-CM

## 2020-07-07 MED ORDER — MEDROXYPROGESTERONE ACETATE 150 MG/ML IM SUSP
150.0000 mg | Freq: Once | INTRAMUSCULAR | Status: AC
  Administered 2020-07-07: 150 mg via INTRAMUSCULAR

## 2020-07-07 NOTE — Progress Notes (Signed)
11 weeks post depo. Voices no concerns. Depo given today per order by Maximiano Coss, PA-C dated 08/14/2019. Tolerated depo well in R delt. Next depo and return physical due 09/22/2020, pt aware. Lang line, interpreter today.  Jerel Shepherd, RN

## 2020-09-22 ENCOUNTER — Other Ambulatory Visit: Payer: Self-pay

## 2020-09-22 ENCOUNTER — Ambulatory Visit (LOCAL_COMMUNITY_HEALTH_CENTER): Payer: Self-pay | Admitting: Advanced Practice Midwife

## 2020-09-22 ENCOUNTER — Ambulatory Visit: Payer: Self-pay

## 2020-09-22 ENCOUNTER — Encounter: Payer: Self-pay | Admitting: Advanced Practice Midwife

## 2020-09-22 VITALS — BP 117/80 | Ht 59.0 in | Wt 131.2 lb

## 2020-09-22 DIAGNOSIS — E663 Overweight: Secondary | ICD-10-CM

## 2020-09-22 DIAGNOSIS — Z3009 Encounter for other general counseling and advice on contraception: Secondary | ICD-10-CM

## 2020-09-22 DIAGNOSIS — Z3042 Encounter for surveillance of injectable contraceptive: Secondary | ICD-10-CM

## 2020-09-22 LAB — WET PREP FOR TRICH, YEAST, CLUE
Trichomonas Exam: NEGATIVE
Yeast Exam: NEGATIVE

## 2020-09-22 MED ORDER — MEDROXYPROGESTERONE ACETATE 150 MG/ML IM SUSP
150.0000 mg | INTRAMUSCULAR | Status: AC
  Administered 2020-09-22: 150 mg via INTRAMUSCULAR

## 2020-09-22 NOTE — Progress Notes (Signed)
Pt to clinic for physical and depo. Pt is 11.0 weeks post depo today.

## 2020-09-22 NOTE — Progress Notes (Signed)
[facility] [address] Main Number: [phone]    Family Planning Visit- Initial Visit  Subjective:  Dominique Durham is a 30 y.o. MHF nonsmoker G2P2002 (10,4)  being seen today for an initial annual visit and to discuss contraceptive options.  The patient is currently using Depo Provera for pregnancy prevention. Patient reports she does not want a pregnancy in the next year.  Patient has the following medical conditions has Mycobacterium tuberculosis infection and Overweight BMI=26.5 on their problem list.  Chief Complaint  Patient presents with   Annual Exam   Contraception    Depo (in arm)    Patient reports last PE 08/23/19. Nexplanon removed 08/23/19. DMPA begun 08/14/19. Last DMPA 07/07/20. Last Pap 04/20/2017 neg.LMP 09/05/20. Last sex 09/04/20 without condom; with current partner x 10 years; 1 partner in last 3 mo. Not working. Living with her brother and her 2 children.   Patient denies cigs, vaping, cigars  Body mass index is 26.5 kg/m. - Patient is eligible for diabetes screening based on BMI and age >66?  not applicable HA1C ordered? not applicable  Patient reports 1  partner/s in last year. Desires STI screening?  Yes  Has patient been screened once for HCV in the past?  No  No results found for: HCVAB  Does the patient have current drug use (including MJ), have a partner with drug use, and/or has been incarcerated since last result? No  If yes-- Screen for HCV through [facility]   Does the patient meet criteria for HBV testing? No  Criteria:  -Household, sexual or needle sharing contact with HBV -History of drug use -HIV positive -Those with known Hep C   Health Maintenance Due  Topic Date Due   Hepatitis C Screening  Never done   PAP-Cervical Cytology Screening  04/20/2020   PAP SMEAR-Modifier  04/20/2020    Review of Systems  All other systems reviewed and are negative.  The following  portions of the patient's history were reviewed and updated as appropriate: allergies, current medications, past family history, past medical history, past social history, past surgical history and problem list. Problem list updated.   See flowsheet for other program required questions.  Objective:   Vitals:   09/22/20 0937  BP: 117/80  Weight: 131 lb 3.2 oz (59.5 kg)  Height: 4\' 11"  (1.499 m)    Physical Exam Constitutional:      Appearance: Normal appearance. She is normal weight.  HENT:     Head: Normocephalic and atraumatic.     Mouth/Throat:     Mouth: Mucous membranes are moist.     Comments: Last dental exam 08/2020 Eyes:     Conjunctiva/sclera: Conjunctivae normal.  Neck:     Thyroid: No thyroid mass, thyromegaly or thyroid tenderness.  Cardiovascular:     Rate and Rhythm: Normal rate and regular rhythm.  Pulmonary:     Effort: Pulmonary effort is normal.     Breath sounds: Normal breath sounds.  Chest:  Breasts:    Right: Normal.     Left: Normal.  Abdominal:     Palpations: Abdomen is soft.     Tenderness: There is no right CVA tenderness, left CVA tenderness or guarding.     Comments: Soft without masses or tenderness, good tone  Genitourinary:    General: Normal vulva.     Exam position: Lithotomy position.     Vagina: Vaginal discharge (small amt white creamy leukorrhea, ph equivocal) present.  Rectum: Normal.  Musculoskeletal:        General: Normal range of motion.     Cervical back: Normal range of motion and neck supple.  Skin:    General: Skin is warm and dry.  Neurological:     Mental Status: She is alert.  Psychiatric:        Mood and Affect: Mood normal.      Assessment and Plan:  Dominique Durham is a 30 y.o. female presenting to the [facility] Department for an initial annual wellness/contraceptive visit  Contraception counseling: Reviewed all forms of birth control options in the tiered based approach. available  including abstinence; over the counter/barrier methods; hormonal contraceptive medication including pill, patch, ring, injection,contraceptive implant, ECP; hormonal and nonhormonal IUDs; permanent sterilization options including vasectomy and the various tubal sterilization modalities. Risks, benefits, and typical effectiveness rates were reviewed.  Questions were answered.  Written information was also given to the patient to review.  Patient desires continuation of DMPA, this was prescribed for patient. She will follow up in  11-13 wks for surveillance.  She was told to call with any further questions, or with any concerns about this method of contraception.  Emphasized use of condoms 100% of the time for STI prevention.  Patient was offered ECP.  ECP was not accepted by the patient. ECP counseling was not given - see RN documentation  1. Overweight BMI=26.5  2. Family planning Treat wet mount per standing orders Immunization nurse consult  - WET PREP FOR TRICH, YEAST, CLUE - Chlamydia/Gonorrhea [facility] - IGP, rfx Aptima HPV ASCU  3. Encounter for surveillance of injectable contraceptive DMPA 150 mg IM q 11-13 wks x 1 year     No follow-ups on file.  No future appointments.  Alberteen Spindle, CNM

## 2020-09-22 NOTE — Progress Notes (Signed)
Wet mount reviewed, no tx per standing order. Provider orders completed. 

## 2020-09-25 LAB — IGP, RFX APTIMA HPV ASCU: PAP Smear Comment: 0

## 2021-06-17 ENCOUNTER — Emergency Department
Admission: EM | Admit: 2021-06-17 | Discharge: 2021-06-17 | Disposition: A | Discharge status: Home / Self Care | Payer: 59 | Attending: Emergency Medicine | Admitting: Emergency Medicine

## 2021-06-17 ENCOUNTER — Other Ambulatory Visit: Payer: Self-pay

## 2021-06-17 ENCOUNTER — Emergency Department: Payer: 59

## 2021-06-17 ENCOUNTER — Encounter: Payer: Self-pay | Admitting: Emergency Medicine

## 2021-06-17 DIAGNOSIS — N3001 Acute cystitis with hematuria: Secondary | ICD-10-CM | POA: Diagnosis not present

## 2021-06-17 DIAGNOSIS — R109 Unspecified abdominal pain: Secondary | ICD-10-CM | POA: Diagnosis present

## 2021-06-17 DIAGNOSIS — N390 Urinary tract infection, site not specified: Secondary | ICD-10-CM | POA: Insufficient documentation

## 2021-06-17 LAB — URINALYSIS, ROUTINE W REFLEX MICROSCOPIC
Bacteria, UA: NONE SEEN
Bilirubin Urine: NEGATIVE
Glucose, UA: NEGATIVE mg/dL
Ketones, ur: NEGATIVE mg/dL
Nitrite: NEGATIVE
Protein, ur: 100 mg/dL — AB
RBC / HPF: >50 RBC/hpf — ABNORMAL HIGH (ref 0–5)
Specific Gravity, Urine: 1.02 (ref 1.005–1.030)
WBC, UA: >50 WBC/hpf — ABNORMAL HIGH (ref 0–5)
pH: 8 (ref 5.0–8.0)

## 2021-06-17 LAB — CBC
HCT: 42.6 % (ref 36.0–46.0)
Hemoglobin: 14.3 g/dL (ref 12.0–15.0)
MCH: 30.8 pg (ref 26.0–34.0)
MCHC: 33.6 g/dL (ref 30.0–36.0)
MCV: 91.6 fL (ref 80.0–100.0)
Platelets: 228 10*3/uL (ref 150–400)
RBC: 4.65 MIL/uL (ref 3.87–5.11)
RDW: 13.4 % (ref 11.5–15.5)
WBC: 12.6 10*3/uL — ABNORMAL HIGH (ref 4.0–10.5)
nRBC: 0 % (ref 0.0–0.2)

## 2021-06-17 LAB — BASIC METABOLIC PANEL
Anion gap: 10 (ref 5–15)
BUN: 11 mg/dL (ref 6–20)
CO2: 23 mmol/L (ref 22–32)
Calcium: 9 mg/dL (ref 8.9–10.3)
Chloride: 108 mmol/L (ref 98–111)
Creatinine, Ser: 0.58 mg/dL (ref 0.44–1.00)
GFR, Estimated: >60 mL/min (ref >60)
Glucose, Bld: 109 mg/dL — ABNORMAL HIGH (ref 70–99)
Potassium: 3.8 mmol/L (ref 3.5–5.1)
Sodium: 141 mmol/L (ref 135–145)

## 2021-06-17 LAB — PREGNANCY, URINE: Preg Test, Ur: NEGATIVE

## 2021-06-17 MED ORDER — SODIUM CHLORIDE 0.9 % IV SOLN
1.0000 g | Freq: Once | INTRAVENOUS | Status: AC
  Administered 2021-06-17: 1 g via INTRAVENOUS
  Filled 2021-06-17: qty 10

## 2021-06-17 MED ORDER — KETOROLAC TROMETHAMINE 30 MG/ML IJ SOLN
30.0000 mg | Freq: Once | INTRAMUSCULAR | Status: AC
  Administered 2021-06-17: 30 mg via INTRAVENOUS
  Filled 2021-06-17: qty 1

## 2021-06-17 MED ORDER — HYDROCODONE-ACETAMINOPHEN 5-325 MG PO TABS
1.0000 | ORAL_TABLET | Freq: Once | ORAL | Status: AC
  Administered 2021-06-17: 1 via ORAL
  Filled 2021-06-17: qty 1

## 2021-06-17 MED ORDER — CEPHALEXIN 500 MG PO CAPS: 500.0000 mg | ORAL_CAPSULE | Freq: Four times a day (QID) | ORAL | 0 refills | Status: AC

## 2021-06-17 MED ORDER — SODIUM CHLORIDE 0.9 % IV BOLUS
500.0000 mL | Freq: Once | INTRAVENOUS | Status: AC
Start: 2021-06-17 — End: 2021-06-17
  Administered 2021-06-17: 500 mL via INTRAVENOUS

## 2021-06-17 NOTE — ED Provider Notes (Signed)
? ?[facility] ?Provider Note ? ?Patient Contact: 4:25 PM (approximate) ? ? ?History  ? ?Hematuria and Abdominal Pain ? ? ?HPI ? ?Dominique Durham is a 31 y.o. female who presents the emergency department complaining of dysuria, suprapubic pain radiating into her back.  Patient has also experienced some hematuria.  No vaginal bleeding or discharge.  Symptoms began this morning.  No history of UTIs.  Patient has no concern for STDs.  Patient with no history of nephrolithiasis.  Given the amount of pain patient went to urgent care and they referred her to the emergency department for further evaluation.  No fevers or chills, nausea vomiting, diarrhea or constipation. ?  ? ? ?Physical Exam  ? ?Triage Vital Signs: ?ED Triage Vitals  ?Enc Vitals Group  ?   BP 06/17/21 1545 (!) 145/93  ?   Pulse Rate 06/17/21 1545 66  ?   Resp 06/17/21 1545 17  ?   Temp 06/17/21 1545 98.4 ?F (36.9 ?C)  ?   Temp Source 06/17/21 1545 Oral  ?   SpO2 06/17/21 1545 99 %  ?   Weight 06/17/21 1542 131 lb 2.8 oz (59.5 kg)  ?   Height 06/17/21 1542 4\' 11"  (1.499 m)  ?   Head Circumference --   ?   Peak Flow --   ?   Pain Score 06/17/21 1542 10  ?   Pain Loc --   ?   Pain Edu? --   ?   Excl. in GC? --   ? ? ?Most recent vital signs: ?Vitals:  ? 06/17/21 1545 06/17/21 2014  ?BP: (!) 145/93 133/79  ?Pulse: 66 69  ?Resp: 17 20  ?Temp: 98.4 ?F (36.9 ?C)   ?SpO2: 99% 100%  ? ? ? ?General: Alert and in no acute distress.  ?Cardiovascular:  Good peripheral perfusion ?Respiratory: Normal respiratory effort without tachypnea or retractions. Lungs CTAB. Good air entry to the bases with no decreased or absent breath sounds. ?Gastrointestinal: Bowel sounds ?4 quadrants.  Soft to palpation all quadrants.  Patient is tender to palpation in the suprapubic region.. No guarding or rigidity. No palpable masses. No distention. No CVA tenderness. ?Musculoskeletal: Full range of motion to all extremities.  ?Neurologic:  No gross focal neurologic  deficits are appreciated.  ?Skin:   No rash noted ?Other: ? ? ?ED Results / Procedures / Treatments  ? ?Labs ?(all labs ordered are listed, but only abnormal results are displayed) ?Labs Reviewed  ?URINALYSIS, ROUTINE W REFLEX MICROSCOPIC - Abnormal; Notable for the following components:  ?    Result Value  ? Color, Urine YELLOW (*)   ? APPearance CLOUDY (*)   ? Hgb urine dipstick LARGE (*)   ? Protein, ur 100 (*)   ? Leukocytes,Ua LARGE (*)   ? RBC / HPF >50 (*)   ? WBC, UA >50 (*)   ? All other components within normal limits  ?BASIC METABOLIC PANEL - Abnormal; Notable for the following components:  ? Glucose, Bld 109 (*)   ? All other components within normal limits  ?CBC - Abnormal; Notable for the following components:  ? WBC 12.6 (*)   ? All other components within normal limits  ?PREGNANCY, URINE  ? ? ? ?EKG ? ? ? ? ?RADIOLOGY ? ?I personally viewed and evaluated these images as part of my medical decision making, as well as reviewing the written report by the radiologist. ? ?ED Provider Interpretation: No acute findings in the abdomen or  pelvis.  Specifically no evidence of pyelonephritis, hydronephrosis, nephrolithiasis or obstructive uropathy ? ?CT Renal Stone Study ? ?Result Date: 06/17/2021 ?CLINICAL DATA:  Flank pain, kidney stone suspected. Hematuria with low abdominal pain today. EXAM: CT ABDOMEN AND PELVIS WITHOUT CONTRAST TECHNIQUE: Multidetector CT imaging of the abdomen and pelvis was performed following the standard protocol without IV contrast. RADIATION DOSE REDUCTION: This exam was performed according to the departmental dose-optimization program which includes automated exposure control, adjustment of the mA and/or kV according to patient size and/or use of iterative reconstruction technique. COMPARISON:  None. FINDINGS: Lower chest: Clear lung bases. No significant pleural or pericardial effusion. Hepatobiliary: The liver appears unremarkable as imaged in the noncontrast state. No evidence of  gallstones, gallbladder wall thickening or biliary dilatation. Pancreas: Unremarkable. No pancreatic ductal dilatation or surrounding inflammatory changes. Spleen: Normal in size without focal abnormality. Adrenals/Urinary Tract: Both adrenal glands appear normal. No evidence of urinary tract calculus, hydronephrosis or perinephric soft tissue stranding. Both kidneys appear unremarkable as imaged in the noncontrast state. The bladder appears unremarkable. Stomach/Bowel: No enteric contrast administered. The stomach appears unremarkable for its degree of distension. No evidence of bowel wall thickening, distention or surrounding inflammatory change. The appendix appears normal. Vascular/Lymphatic: There are no enlarged abdominal or pelvic lymph nodes. No significant vascular findings are seen on noncontrast imaging. Reproductive: The uterus and ovaries appear unremarkable. The uterus is retroverted. No adnexal mass. Other: No evidence of abdominal wall mass or hernia. No ascites. Musculoskeletal: No acute or significant osseous findings. IMPRESSION: Unremarkable CT of the abdomen and pelvis. No evidence of urinary tract calculus or hydronephrosis. No acute findings or explanation for the patient's symptoms. Electronically Signed   By: Carey BullocksWilliam  Veazey M.D.   On: 06/17/2021 17:23   ? ?PROCEDURES: ? ?Critical Care performed: No ? ?Procedures ? ? ?MEDICATIONS ORDERED IN ED: ?Medications  ?HYDROcodone-acetaminophen (NORCO/VICODIN) 5-325 MG per tablet 1 tablet (1 tablet Oral Given 06/17/21 1647)  ?ketorolac (TORADOL) 30 MG/ML injection 30 mg (30 mg Intravenous Given 06/17/21 1926)  ?cefTRIAXone (ROCEPHIN) 1 g in sodium chloride 0.9 % 100 mL IVPB (0 g Intravenous Stopped 06/17/21 2017)  ?sodium chloride 0.9 % bolus 500 mL (0 mLs Intravenous Stopped 06/17/21 2017)  ? ? ? ?IMPRESSION / MDM / ASSESSMENT AND PLAN / ED COURSE  ?I reviewed the triage vital signs and the nursing notes. ?             ?               ? ?Differential  diagnosis includes, but is not limited to, UTI, pyelonephritis, nephrolithiasis ? ? ?Patient's diagnosis is consistent with acute UTI.  Patient presented to the emergency department with suprapubic pain, dysuria, polyuria.  Given the blood, and the fact that the pain was also radiating into her low back and CT was obtained to ensure no evidence of nephrolithiasis.  She had no CVA tenderness.  Antibiotics for UTI..  Follow-up primary care as needed.  Return precautions discussed with the patient.  Patient is given ED precautions to return to the ED for any worsening or new symptoms. ? ? ? ?  ? ? ?FINAL CLINICAL IMPRESSION(S) / ED DIAGNOSES  ? ?Final diagnoses:  ?Acute cystitis with hematuria  ? ? ? ?Rx / DC Orders  ? ?ED Discharge Orders   ? ?      Ordered  ?  cephALEXin (KEFLEX) 500 MG capsule  4 times daily       ? 06/17/21 1941  ? ?  ?  ? ?  ? ? ? ?  Note:  This document was prepared using Dragon voice recognition software and may include unintentional dictation errors. ?  ?Racheal Patches, PA-C ?06/17/21 2350 ? ?  ?Concha Se, MD ?06/20/21 [phone] ? ?

## 2021-06-17 NOTE — ED Triage Notes (Signed)
Pt comes into the ED via POV c/o hematuria and low abd pain that started today.  Pt denies any burning with urination.  Pt denies any kidney stones.   ?

## 2021-12-15 ENCOUNTER — Other Ambulatory Visit: Payer: Self-pay | Admitting: Primary Care

## 2021-12-15 DIAGNOSIS — N644 Mastodynia: Secondary | ICD-10-CM

## 2022-01-01 ENCOUNTER — Ambulatory Visit
Admission: RE | Admit: 2022-01-01 | Discharge: 2022-01-01 | Disposition: A | Discharge status: Home / Self Care | Payer: BLUE CROSS/BLUE SHIELD | Source: Ambulatory Visit | Attending: Primary Care | Admitting: Primary Care

## 2022-01-01 DIAGNOSIS — N644 Mastodynia: Secondary | ICD-10-CM | POA: Diagnosis present

## 2022-01-28 ENCOUNTER — Ambulatory Visit: Payer: Self-pay | Admitting: Urology

## 2022-08-12 ENCOUNTER — Ambulatory Visit (LOCAL_COMMUNITY_HEALTH_CENTER): Payer: BLUE CROSS/BLUE SHIELD

## 2022-08-12 VITALS — BP 116/78 | Ht 59.0 in | Wt 131.0 lb

## 2022-08-12 DIAGNOSIS — Z308 Encounter for other contraceptive management: Secondary | ICD-10-CM

## 2022-08-12 DIAGNOSIS — Z3202 Encounter for pregnancy test, result negative: Secondary | ICD-10-CM | POA: Diagnosis not present

## 2022-08-12 LAB — PREGNANCY, URINE: Preg Test, Ur: NEGATIVE

## 2022-08-12 NOTE — Progress Notes (Signed)
Client seen in nurse clinic for UPT results negative.  Interpretation provided by Language Metta Clines [phone].   Client reports she is using condoms for pregnancy prevention and last had period about 3 weeks ago.  Came for pregnancy test today because has been experiencing, fatigue, nausea and vomiting.  Client counseled to repeat UPT in 2 weeks if she doesn't have her period.  Negative Pregnancy Test Packet Provided.    Jahmere Bramel Sherrilyn Rist, RN

## 2023-08-23 IMAGING — CT CT RENAL STONE PROTOCOL
2 of 4 series · 16 of 46 positions shown, 18 images · non-contrast
Comparison: None.

CLINICAL DATA: Flank pain, kidney stone suspected. Hematuria with
low abdominal pain today.



[Series 2: stone full standard · axial · 0.65mm/px · z∈[-868,-438]mm · 13 of 94 slices shown, 15 images]
[im 4/94  soft-tissue]
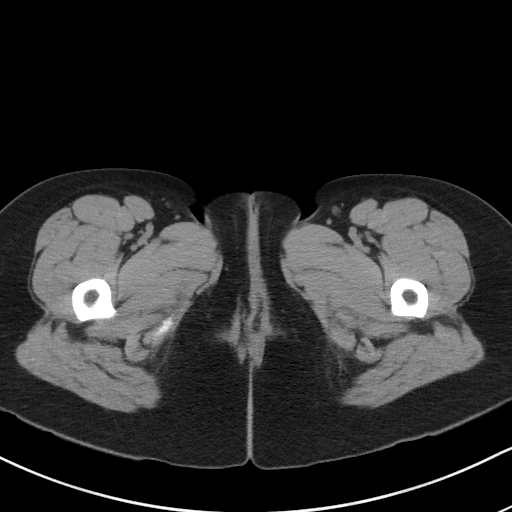
[im 4/94  bone]
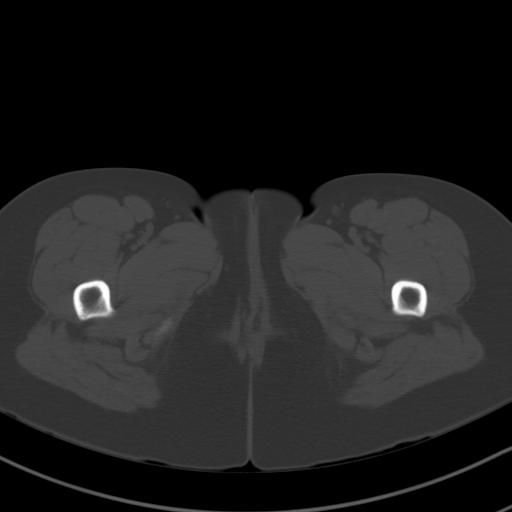
[im 12/94  soft-tissue]
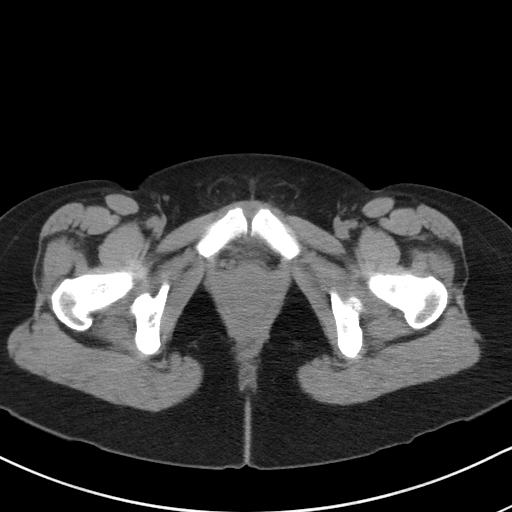
[im 19/94  soft-tissue]
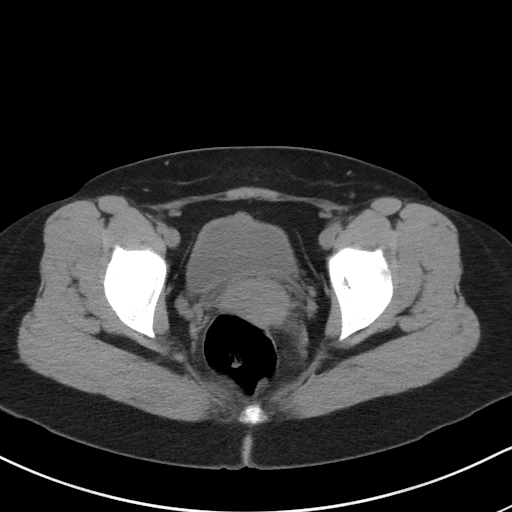
[im 27/94  soft-tissue]
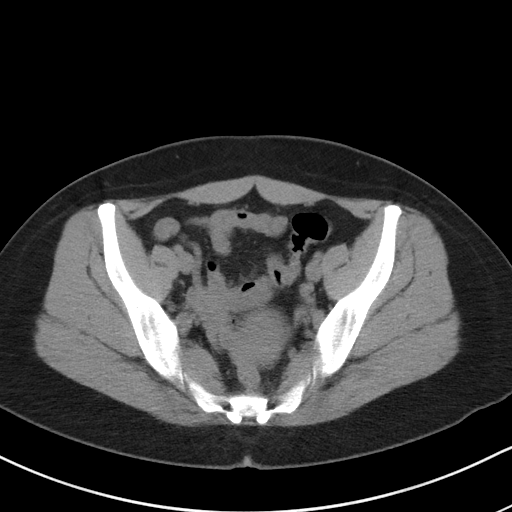
[im 34/94  soft-tissue]
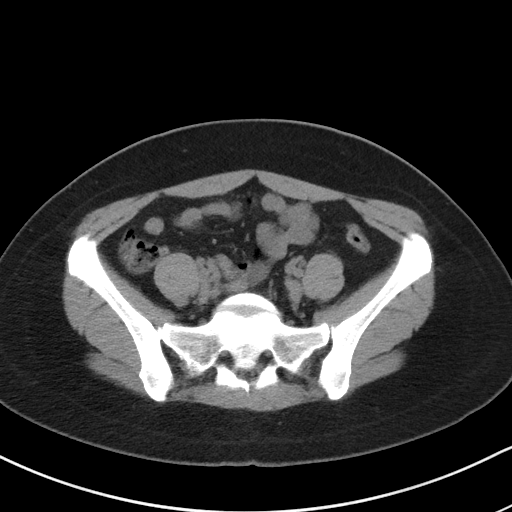
[im 41/94  soft-tissue]
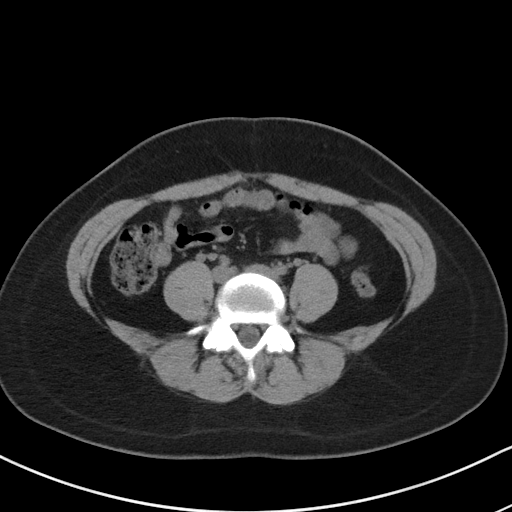
[im 49/94  soft-tissue]
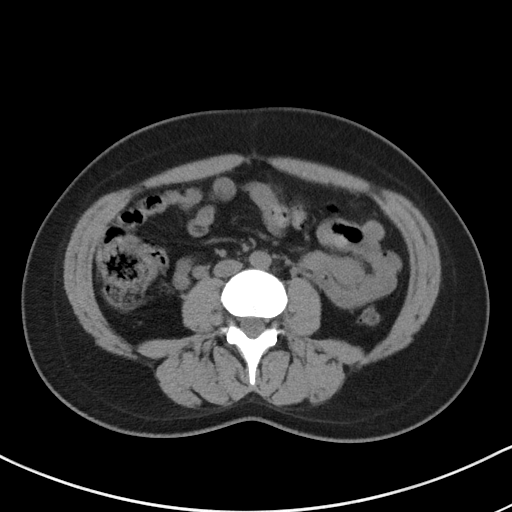
[im 53/94  soft-tissue]
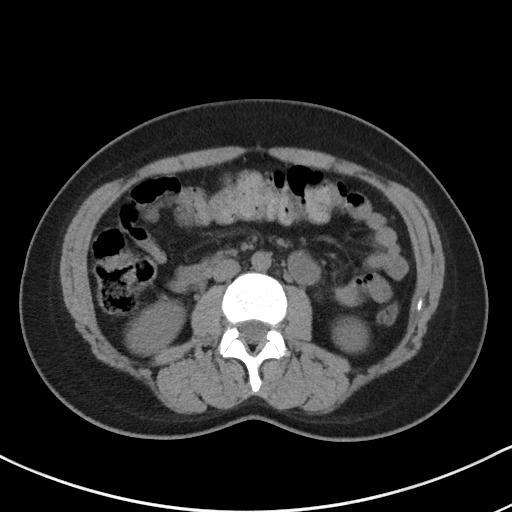
[im 60/94  soft-tissue]
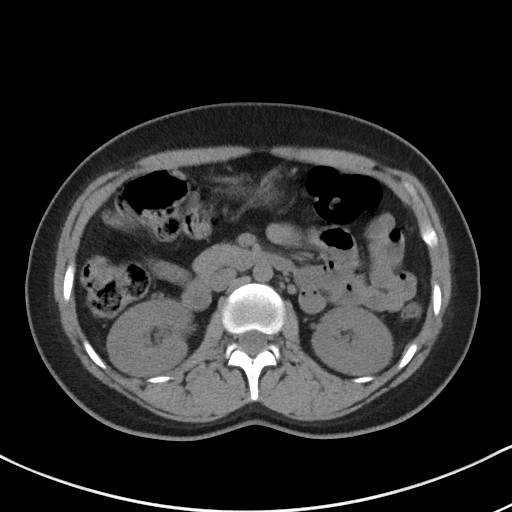
[im 60/94  bone]
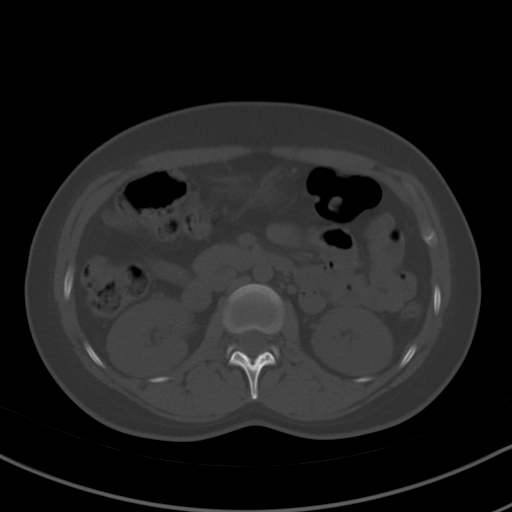
[im 67/94  soft-tissue]
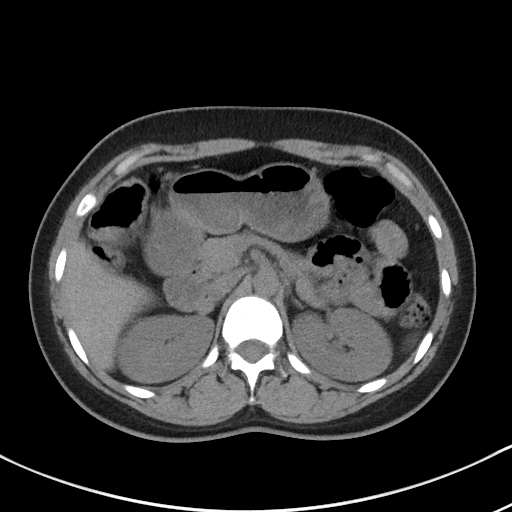
[im 75/94  soft-tissue]
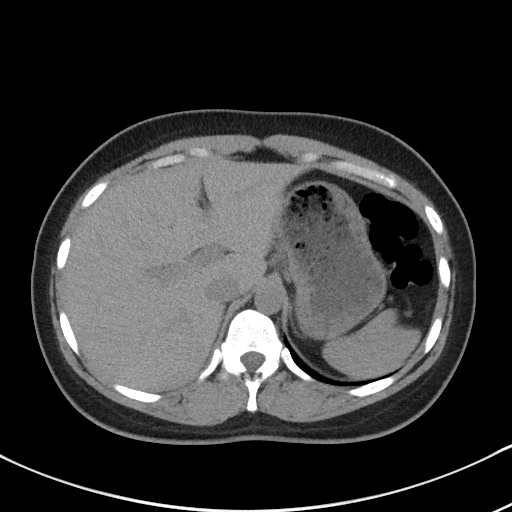
[im 82/94  soft-tissue]
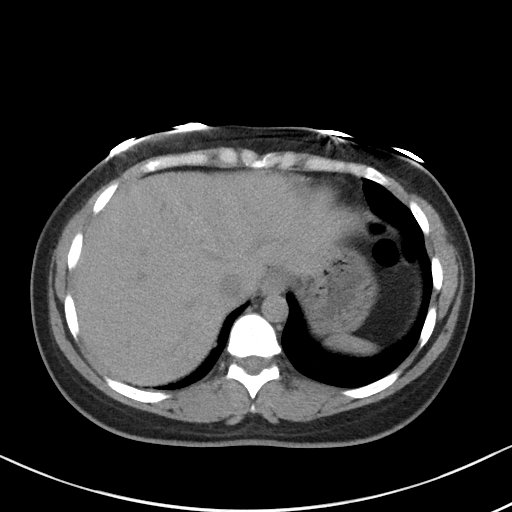
[im 90/94  soft-tissue]
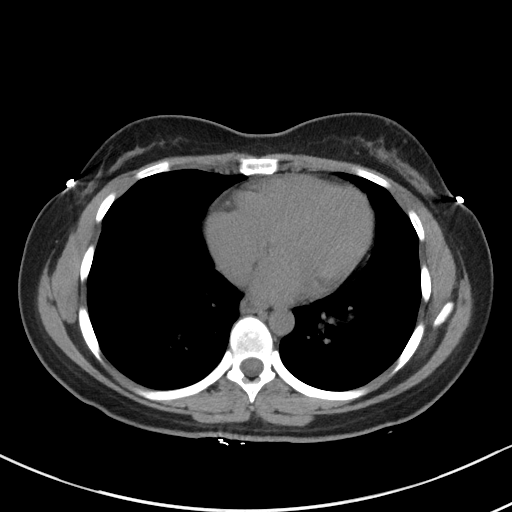

[Series 5: coronal · coronal · 0.82mm/px · 3 of 112 slices shown]
[im 38/112  soft-tissue]
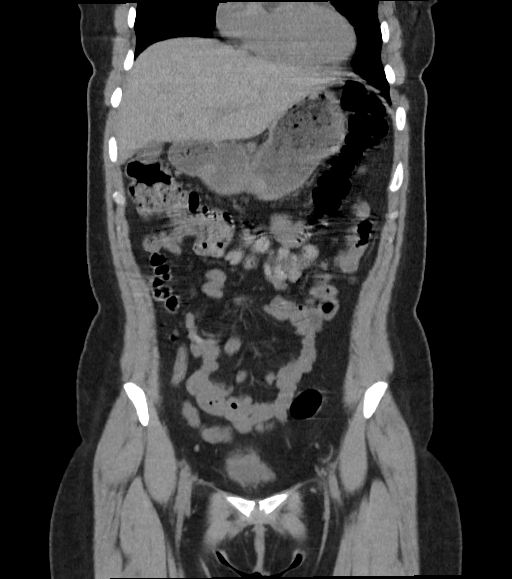
[im 50/112  soft-tissue]
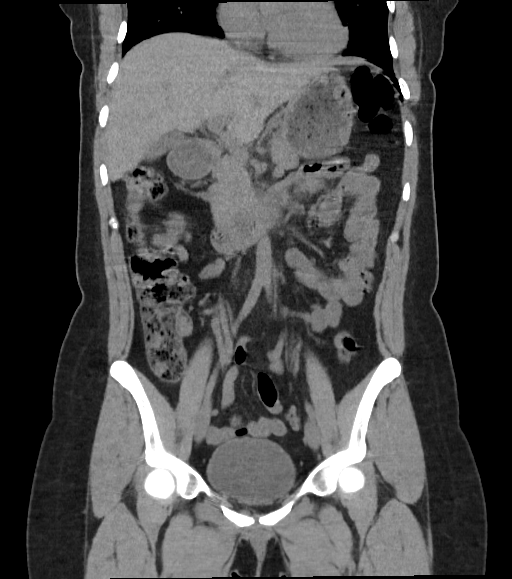
[im 62/112  soft-tissue]
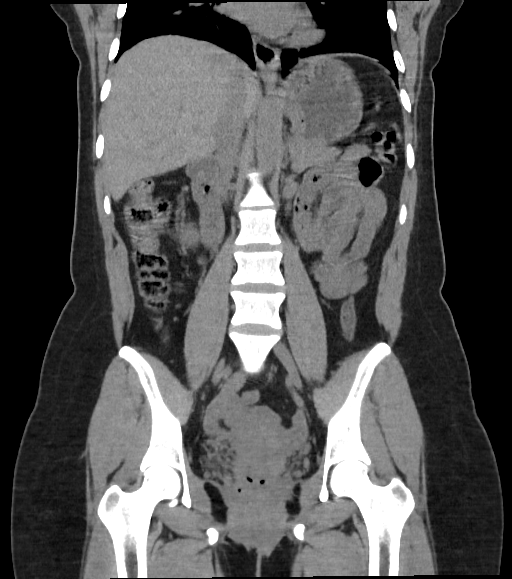

[16 of 46 positions shown; findings below may reference images not displayed]

FINDINGS: Lower chest: Clear lung bases. No significant pleural or pericardial
effusion.

Hepatobiliary: The liver appears unremarkable as imaged in the
noncontrast state. No evidence of gallstones, gallbladder wall
thickening or biliary dilatation.

Pancreas: Unremarkable. No pancreatic ductal dilatation or
surrounding inflammatory changes.

Spleen: Normal in size without focal abnormality.

Adrenals/Urinary Tract: Both adrenal glands appear normal. No
evidence of urinary tract calculus, hydronephrosis or perinephric
soft tissue stranding. Both kidneys appear unremarkable as imaged in
the noncontrast state. The bladder appears unremarkable.

Stomach/Bowel: No enteric contrast administered. The stomach appears
unremarkable for its degree of distension. No evidence of bowel wall
thickening, distention or surrounding inflammatory change. The
appendix appears normal.

Vascular/Lymphatic: There are no enlarged abdominal or pelvic lymph
nodes. No significant vascular findings are seen on noncontrast
imaging.

Reproductive: The uterus and ovaries appear unremarkable. The uterus
is retroverted. No adnexal mass.

Other: No evidence of abdominal wall mass or hernia. No ascites.

Musculoskeletal: No acute or significant osseous findings.
IMPRESSION: Unremarkable CT of the abdomen and pelvis. No evidence of urinary
tract calculus or hydronephrosis. No acute findings or explanation
for the patient's symptoms.
